# Patient Record
Sex: Male | Born: 1965 | Race: White | Hispanic: No | Marital: Married | Smoking: Former smoker
Health system: Southern US, Community
[De-identification: ages and names within clinical notes are randomized; demographics above are authoritative.]

## PROBLEM LIST (undated history)

## (undated) DIAGNOSIS — Z789 Other specified health status: Secondary | ICD-10-CM

## (undated) DIAGNOSIS — G5603 Carpal tunnel syndrome, bilateral upper limbs: Secondary | ICD-10-CM

---

## 1998-05-15 ENCOUNTER — Emergency Department (HOSPITAL_COMMUNITY): Admission: EM | Admit: 1998-05-15 | Discharge: 1998-05-16 | Payer: Self-pay | Admitting: Emergency Medicine

## 2002-08-05 HISTORY — PX: FRACTURE SURGERY: SHX138

## 2012-03-06 ENCOUNTER — Other Ambulatory Visit: Payer: Self-pay | Admitting: Orthopedic Surgery

## 2012-03-13 ENCOUNTER — Encounter (HOSPITAL_BASED_OUTPATIENT_CLINIC_OR_DEPARTMENT_OTHER): Payer: Self-pay | Admitting: *Deleted

## 2012-03-13 NOTE — Progress Notes (Signed)
No cardiac or resp problems-quit smoking 11 yr ago-

## 2012-03-18 ENCOUNTER — Encounter (HOSPITAL_BASED_OUTPATIENT_CLINIC_OR_DEPARTMENT_OTHER)
Admission: RE | Disposition: A | Discharge status: Home / Self Care | Payer: Self-pay | Source: Ambulatory Visit | Attending: Orthopedic Surgery

## 2012-03-18 ENCOUNTER — Encounter (HOSPITAL_BASED_OUTPATIENT_CLINIC_OR_DEPARTMENT_OTHER): Payer: Self-pay | Admitting: Orthopedic Surgery

## 2012-03-18 ENCOUNTER — Ambulatory Visit (HOSPITAL_BASED_OUTPATIENT_CLINIC_OR_DEPARTMENT_OTHER): Payer: 59 | Admitting: Certified Registered Nurse Anesthetist

## 2012-03-18 ENCOUNTER — Encounter (HOSPITAL_BASED_OUTPATIENT_CLINIC_OR_DEPARTMENT_OTHER): Payer: Self-pay | Admitting: *Deleted

## 2012-03-18 ENCOUNTER — Encounter (HOSPITAL_BASED_OUTPATIENT_CLINIC_OR_DEPARTMENT_OTHER): Payer: Self-pay | Admitting: Certified Registered Nurse Anesthetist

## 2012-03-18 ENCOUNTER — Encounter (HOSPITAL_BASED_OUTPATIENT_CLINIC_OR_DEPARTMENT_OTHER): Payer: Self-pay | Admitting: Anesthesiology

## 2012-03-18 ENCOUNTER — Ambulatory Visit (HOSPITAL_BASED_OUTPATIENT_CLINIC_OR_DEPARTMENT_OTHER)
Admission: RE | Admit: 2012-03-18 | Discharge: 2012-03-18 | Disposition: A | Discharge status: Home / Self Care | Payer: 59 | Source: Ambulatory Visit | Attending: Orthopedic Surgery | Admitting: Orthopedic Surgery

## 2012-03-18 DIAGNOSIS — Z791 Long term (current) use of non-steroidal anti-inflammatories (NSAID): Secondary | ICD-10-CM | POA: Insufficient documentation

## 2012-03-18 DIAGNOSIS — G5600 Carpal tunnel syndrome, unspecified upper limb: Secondary | ICD-10-CM | POA: Insufficient documentation

## 2012-03-18 HISTORY — DX: Other specified health status: Z78.9

## 2012-03-18 HISTORY — PX: CARPAL TUNNEL RELEASE: SHX101

## 2012-03-18 HISTORY — DX: Carpal tunnel syndrome, bilateral upper limbs: G56.03

## 2012-03-18 SURGERY — CARPAL TUNNEL RELEASE
Anesthesia: Monitor Anesthesia Care | Site: Hand | Laterality: Left | Wound class: Clean

## 2012-03-18 MED ORDER — DEXTROSE 5 % IV SOLN
3.0000 g | INTRAVENOUS | Status: AC
  Administered 2012-03-18: 3 g via INTRAVENOUS

## 2012-03-18 MED ORDER — FENTANYL CITRATE 0.05 MG/ML IJ SOLN
INTRAMUSCULAR | Status: DC | PRN
  Administered 2012-03-18 (×3): 50 ug via INTRAVENOUS

## 2012-03-18 MED ORDER — HYDROCODONE-ACETAMINOPHEN 5-500 MG PO TABS: 1.0000 | ORAL_TABLET | ORAL | Status: AC | PRN

## 2012-03-18 MED ORDER — OXYCODONE HCL 5 MG PO TABS: 5.0000 mg | ORAL_TABLET | Freq: Once | ORAL | Status: DC | PRN

## 2012-03-18 MED ORDER — FENTANYL CITRATE 0.05 MG/ML IJ SOLN: 25.0000 ug | INTRAMUSCULAR | Status: DC | PRN

## 2012-03-18 MED ORDER — METOCLOPRAMIDE HCL 5 MG/ML IJ SOLN: 10.0000 mg | Freq: Once | INTRAMUSCULAR | Status: DC | PRN

## 2012-03-18 MED ORDER — BUPIVACAINE HCL (PF) 0.25 % IJ SOLN
INTRAMUSCULAR | Status: DC | PRN
  Administered 2012-03-18: 5 mL

## 2012-03-18 MED ORDER — LACTATED RINGERS IV SOLN
INTRAVENOUS | Status: DC
  Administered 2012-03-18 (×3): via INTRAVENOUS

## 2012-03-18 MED ORDER — CHLORHEXIDINE GLUCONATE 4 % EX LIQD: 60.0000 mL | Freq: Once | CUTANEOUS | Status: DC

## 2012-03-18 MED ORDER — DEXAMETHASONE SODIUM PHOSPHATE 4 MG/ML IJ SOLN
INTRAMUSCULAR | Status: DC | PRN
  Administered 2012-03-18: 10 mg via INTRAVENOUS

## 2012-03-18 MED ORDER — MIDAZOLAM HCL 5 MG/5ML IJ SOLN
INTRAMUSCULAR | Status: DC | PRN
  Administered 2012-03-18 (×2): 1 mg via INTRAVENOUS

## 2012-03-18 MED ORDER — PROPOFOL 10 MG/ML IV EMUL
INTRAVENOUS | Status: DC | PRN
  Administered 2012-03-18: 100 ug/kg/min via INTRAVENOUS

## 2012-03-18 MED ORDER — LIDOCAINE HCL (PF) 0.5 % IJ SOLN
INTRAMUSCULAR | Status: DC | PRN
  Administered 2012-03-18: 60 mL via INTRATHECAL

## 2012-03-18 MED ORDER — OXYCODONE HCL 5 MG/5ML PO SOLN: 5.0000 mg | Freq: Once | ORAL | Status: DC | PRN

## 2012-03-18 MED ORDER — 0.9 % SODIUM CHLORIDE (POUR BTL) OPTIME
TOPICAL | Status: DC | PRN
  Administered 2012-03-18: 1000 mL

## 2012-03-18 SURGICAL SUPPLY — 35 items
BANDAGE GAUZE ELAST BULKY 4 IN (GAUZE/BANDAGES/DRESSINGS) ×2 IMPLANT
BLADE SURG 15 STRL LF DISP TIS (BLADE) ×1 IMPLANT
BLADE SURG 15 STRL SS (BLADE) ×2
BNDG CMPR 9X4 STRL LF SNTH (GAUZE/BANDAGES/DRESSINGS)
BNDG COHESIVE 3X5 TAN STRL LF (GAUZE/BANDAGES/DRESSINGS) ×2 IMPLANT
BNDG ESMARK 4X9 LF (GAUZE/BANDAGES/DRESSINGS) IMPLANT
CHLORAPREP W/TINT 26ML (MISCELLANEOUS) ×2 IMPLANT
CLOTH BEACON ORANGE TIMEOUT ST (SAFETY) ×2 IMPLANT
CORDS BIPOLAR (ELECTRODE) ×2 IMPLANT
COVER MAYO STAND STRL (DRAPES) ×2 IMPLANT
COVER TABLE BACK 60X90 (DRAPES) ×2 IMPLANT
CUFF TOURNIQUET SINGLE 18IN (TOURNIQUET CUFF) ×2 IMPLANT
DRAPE EXTREMITY T 121X128X90 (DRAPE) ×2 IMPLANT
DRAPE SURG 17X23 STRL (DRAPES) ×2 IMPLANT
DRSG KUZMA FLUFF (GAUZE/BANDAGES/DRESSINGS) ×2 IMPLANT
GAUZE XEROFORM 1X8 LF (GAUZE/BANDAGES/DRESSINGS) ×2 IMPLANT
GLOVE BIO SURGEON STRL SZ 6.5 (GLOVE) ×2 IMPLANT
GLOVE SURG ORTHO 8.0 STRL STRW (GLOVE) ×2 IMPLANT
GOWN BRE IMP PREV XXLGXLNG (GOWN DISPOSABLE) ×2 IMPLANT
GOWN PREVENTION PLUS XLARGE (GOWN DISPOSABLE) ×2 IMPLANT
NEEDLE 27GAX1X1/2 (NEEDLE) ×2 IMPLANT
NS IRRIG 1000ML POUR BTL (IV SOLUTION) ×2 IMPLANT
PACK BASIN DAY SURGERY FS (CUSTOM PROCEDURE TRAY) ×2 IMPLANT
PAD CAST 3X4 CTTN HI CHSV (CAST SUPPLIES) ×1 IMPLANT
PADDING CAST ABS 4INX4YD NS (CAST SUPPLIES) ×1
PADDING CAST ABS COTTON 4X4 ST (CAST SUPPLIES) ×1 IMPLANT
PADDING CAST COTTON 3X4 STRL (CAST SUPPLIES) ×2
SPONGE GAUZE 4X4 12PLY (GAUZE/BANDAGES/DRESSINGS) ×2 IMPLANT
STOCKINETTE 4X48 STRL (DRAPES) ×2 IMPLANT
SUT VICRYL 4-0 PS2 18IN ABS (SUTURE) IMPLANT
SUT VICRYL RAPIDE 4/0 PS 2 (SUTURE) ×2 IMPLANT
SYR BULB 3OZ (MISCELLANEOUS) ×2 IMPLANT
SYR CONTROL 10ML LL (SYRINGE) ×2 IMPLANT
TOWEL OR 17X24 6PK STRL BLUE (TOWEL DISPOSABLE) ×2 IMPLANT
UNDERPAD 30X30 INCONTINENT (UNDERPADS AND DIAPERS) ×2 IMPLANT

## 2012-03-18 NOTE — Anesthesia Postprocedure Evaluation (Signed)
Anesthesia Post Note  Patient: Douglas Ramos  Procedure(s) Performed: Procedure(s) (LRB): CARPAL TUNNEL RELEASE (Left)  Anesthesia type: MAC  Patient location: PACU  Post pain: Pain level controlled  Post assessment: Patient's Cardiovascular Status Stable  Last Vitals:  Filed Vitals:   03/18/12 1253  BP: 150/83  Pulse: 69  Temp: 36.5 C  Resp: 20    Post vital signs: Reviewed and stable  Level of consciousness: alert  Complications: No apparent anesthesia complications

## 2012-03-18 NOTE — Transfer of Care (Signed)
Immediate Anesthesia Transfer of Care Note  Patient: Douglas Ramos  Procedure(s) Performed: Procedure(s) (LRB): CARPAL TUNNEL RELEASE (Left)  Patient Location: PACU  Anesthesia Type: MAC and Bier block  Level of Consciousness: awake and alert   Airway & Oxygen Therapy: Patient Spontanous Breathing and Patient connected to face mask oxygen  Post-op Assessment: Report given to PACU RN and Post -op Vital signs reviewed and stable  Post vital signs: Reviewed and stable  Complications: No apparent anesthesia complications

## 2012-03-18 NOTE — Anesthesia Preprocedure Evaluation (Signed)
Anesthesia Evaluation  Patient identified by MRN, date of birth, ID band Patient awake    Reviewed: Allergy & Precautions, H&P , NPO status , Patient's Chart, lab work & pertinent test results, reviewed documented beta blocker date and time   Airway Mallampati: II TM Distance: >3 FB Neck ROM: full    Dental   Pulmonary neg pulmonary ROS, former smoker,  breath sounds clear to auscultation        Cardiovascular negative cardio ROS  Rhythm:regular     Neuro/Psych negative neurological ROS  negative psych ROS   GI/Hepatic negative GI ROS, Neg liver ROS,   Endo/Other  negative endocrine ROS  Renal/GU negative Renal ROS  negative genitourinary   Musculoskeletal   Abdominal   Peds  Hematology negative hematology ROS (+)   Anesthesia Other Findings See surgeon's H&P   Reproductive/Obstetrics negative OB ROS                           Anesthesia Physical Anesthesia Plan  ASA: II  Anesthesia Plan: MAC and Bier Block   Post-op Pain Management:    Induction: Intravenous  Airway Management Planned: Simple Face Mask  Additional Equipment:   Intra-op Plan:   Post-operative Plan:   Informed Consent: I have reviewed the patients History and Physical, chart, labs and discussed the procedure including the risks, benefits and alternatives for the proposed anesthesia with the patient or authorized representative who has indicated his/her understanding and acceptance.   Dental Advisory Given  Plan Discussed with: CRNA and Surgeon  Anesthesia Plan Comments:         Anesthesia Quick Evaluation

## 2012-03-18 NOTE — Brief Op Note (Signed)
03/18/2012  12:19 PM  PATIENT:  Douglas Ramos  46 y.o. male  PRE-OPERATIVE DIAGNOSIS:  LEFT CARPAL TUNNEL SYNDROME  POST-OPERATIVE DIAGNOSIS:  LEFT CARPAL TUNNEL SYNDROME  PROCEDURE:  Procedure(s) (LRB): CARPAL TUNNEL RELEASE (Left)  SURGEON:  Surgeon(s) and Role:    * Nicki Reaper, MD - Primary  PHYSICIAN ASSISTANT:   ASSISTANTS: none   ANESTHESIA:   local and regional  EBL:     BLOOD ADMINISTERED:none  DRAINS: none   LOCAL MEDICATIONS USED:  MARCAINE     SPECIMEN:  No Specimen  DISPOSITION OF SPECIMEN:  N/A  COUNTS:  YES  TOURNIQUET:   Total Tourniquet Time Documented: Forearm (Left) - 19 minutes  DICTATION: .Other Dictation: Dictation Number [id number]  PLAN OF CARE: Discharge to home after PACU  PATIENT DISPOSITION:  PACU - hemodynamically stable.

## 2012-03-18 NOTE — Op Note (Signed)
Dictated number: [id number]

## 2012-03-18 NOTE — H&P (Signed)
Douglas Ramos is a 46 year old right hand dominant male who comes in complaining of bilateral hand numbness, tingling and pain to all fingers. This has been going on for the past 3-4 years. It has been increasing. He is awakened 6-7 out of 7 nights. He has no history of injury to the hands or neck. The left is slightly worse than the right. No history of diabetes, thyroid problems, arthritis or gout. There is no family history of each of these. He states driving is a problem for him with numbness and tingling. He complains of moderate, throbbing and aching pain. This has been gradually getting worse. Activity makes it worse. He has tried Advil 2-3 a day along with braces which have not helped.   Past Medical History: He has no known drug allergies. He states no medicines. He has had no surgery.  Family Medical History: Negative.  Social History: He does not smoke. He drinks socially. 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  Review of Systems: Positive for glasses otherwise negative for 14 points.  Douglas Ramos is an 46 y.o. male.   Chief Complaint: CTS Lt  HPI: see above  Past Medical History  Diagnosis Date  . No pertinent past medical history   . Carpal tunnel syndrome, bilateral     Past Surgical History  Procedure Date  . Fracture surgery 2004    orif lt foot-pins/screws    History reviewed. No pertinent family history. Social History:  reports that he quit smoking about 11 years ago. He does not have any smokeless tobacco history on file. He reports that he drinks alcohol. He reports that he does not use illicit drugs.  Allergies: No Known Allergies  Medications Prior to Admission  Medication Sig Dispense Refill  . ibuprofen (ADVIL,MOTRIN) 200 MG tablet Take 200 mg by mouth every 6 (six) hours as needed.        No results found for this or any previous visit (from the past 48 hour(s)).  No results found.   Pertinent items are noted in HPI.  Blood pressure  128/80, pulse 56, temperature 98.4 F (36.9 C), temperature source Oral, resp. rate 16, height 6\' 1"  (1.854 m), weight 202 lb (91.627 kg), SpO2 99.00%.  General appearance: alert, cooperative and appears stated age Head: Normocephalic, without obvious abnormality Neck: no adenopathy Resp: clear to auscultation bilaterally Cardio: regular rate and rhythm, S1, S2 normal, no murmur, click, rub or gallop GI: soft, non-tender; bowel sounds normal; no masses,  no organomegaly Extremities: extremities normal, atraumatic, no cyanosis or edema Pulses: 2+ and symmetric Skin: Skin color, texture, turgor normal. No rashes or lesions Neurologic: Grossly normal Incision/Wound: na  Assessment/Plan Diagnosis: Probable carpal tunnel syndrome Douglas Ramos has had his nerve conductions done by Dr. Johna Roles revealing a motor delay of 5.1 on the left,  5.6 on the right, sensory delay of 3.0 on the left and 2.3 on the right with an amplitude diminution of 10.7 on the left and 9.7 on the right.  We have discussed with him the possibility of injections vs surgical release. The pre, peri and post op course are discussed along with risks and complications. He is aware there is no guarantee with surgery, possibility of infection, recurrence, injury to arteries, nerves and tendons, incomplete relief of symptoms and dystrophy.  He would like to proceed to have his left side done foregoing injections. He is scheduled for left carpal tunnel release as an outpatient at [facility].  Douglas Ramos R 03/18/2012, 11:06 AM

## 2012-03-18 NOTE — Anesthesia Procedure Notes (Addendum)
Procedure Name: MAC Date/Time: 03/18/2012 12:00 PM Performed by: Sherrika Weakland D Pre-anesthesia Checklist: Patient identified, Emergency Drugs available, Suction available and Patient being monitored Patient Re-evaluated:Patient Re-evaluated prior to inductionOxygen Delivery Method: Simple face mask Intubation Type: IV induction Placement Confirmation: positive ETCO2   Anesthesia Regional Block:  Bier block (IV Regional)  Pre-Anesthetic Checklist: ,, timeout performed, Correct Patient, Correct Site, Correct Laterality, Correct Procedure,, site marked, risks and benefits discussed, Surgical consent, Pre-op evaluation,  At surgeon's request  Laterality: Left     Needles:  Injection technique: Single-shot      Needle Gauge: 20 and 20 G    Additional Needles: Bier block (IV Regional) Narrative:   Performed by: With CRNAs   Additional Notes: 35cc 0.5% preservative free lidocaine injected, pt tolerated well.

## 2012-03-19 ENCOUNTER — Encounter (HOSPITAL_BASED_OUTPATIENT_CLINIC_OR_DEPARTMENT_OTHER): Payer: Self-pay | Admitting: Orthopedic Surgery

## 2012-03-19 NOTE — Op Note (Signed)
NAMEWALLACE, GAPPA                   ACCOUNT NO.:  [id number]  MEDICAL RECORD NO.:  [id number]  LOCATION:                                 FACILITY:  PHYSICIAN:  Cindee Salt, M.D.            DATE OF BIRTH:  DATE OF PROCEDURE:  03/18/2012 DATE OF DISCHARGE:                              OPERATIVE REPORT   PREOPERATIVE DIAGNOSIS:  Carpal tunnel syndrome, left hand.  POSTOPERATIVE DIAGNOSIS:  Carpal tunnel syndrome, left hand.  OPERATION:  Decompression of left median nerve.  SURGEON:  Cindee Salt, MD  ANESTHESIA:  Forearm-based IV regional with local infiltration.  ANESTHESIOLOGIST:  Janetta Hora. Gelene Mink, M.D.  HISTORY:  The patient is a 46 year old male with history of carpal tunnel syndrome, EMG nerve conductions positive, not responsive to conservative treatment.  He has elected to undergo surgical decompression.  Pre, peri, and postoperative course had been discussed along with risks and complications.  He is aware that there is no guarantee with the surgery, possibility of infection, recurrence, injury to arteries, nerves, tendons, incomplete relief of symptoms, dystrophy. In the preoperative area, the patient was seen, the extremity marked by both the patient and surgeon.  Antibiotic given.  PROCEDURE IN DETAIL:  The patient was brought to the operating room where a forearm-based IV regional anesthetic was carried out without difficulty, was prepped using ChloraPrep, supine position, left arm free.  A 3-minute dry time was allowed.  Time-out taken, confirming the patient and procedure.  After adequate anesthesia was afforded, a longitudinal incision was made in the palm, carried down through subcutaneous tissue.  Bleeders were electrocauterized with bipolar. Palmar fascia was split.  Superficial palmar arch identified.  The flexor tendon and the ring little finger identified to the ulnar side of the median nerve.  Carpal retinaculum was incised with sharp dissection. Right  angle and Sewell retractor were placed between skin and forearm fascia.  The fascia was released for approximately 0.5 cm proximal to the wrist crease.  With a connecting branch between the ulnar nerve and median nerve, this was preserved.  The wound was copiously irrigated with saline.  The skin then closed with interrupted 4-0 Vicryl Rapide sutures.  Local infiltration with 0.25% Marcaine without epinephrine was given, was noted that the nerve was very hyperemic following the decompression indicative of a significant carpal tunnel syndrome, being present.  On deflation of the tourniquet, all fingers immediately pinked.  Compressive dressing.  No splint was applied with the fingers free.  On deflation of the tourniquet, all fingers immediately pinked.  He was taken to the recovery room for observation in satisfactory condition.          ______________________________ Cindee Salt, M.D.     GK/MEDQ  D:  03/18/2012  T:  03/19/2012  Job:  [id number]

## 2012-06-30 ENCOUNTER — Other Ambulatory Visit: Payer: Self-pay | Admitting: Orthopedic Surgery

## 2012-07-21 ENCOUNTER — Encounter (HOSPITAL_BASED_OUTPATIENT_CLINIC_OR_DEPARTMENT_OTHER): Payer: Self-pay | Admitting: *Deleted

## 2012-07-21 NOTE — Progress Notes (Signed)
Here 8/13 for [facility]-did well

## 2012-07-22 ENCOUNTER — Encounter (HOSPITAL_BASED_OUTPATIENT_CLINIC_OR_DEPARTMENT_OTHER): Payer: Self-pay | Admitting: Anesthesiology

## 2012-07-22 ENCOUNTER — Encounter (HOSPITAL_BASED_OUTPATIENT_CLINIC_OR_DEPARTMENT_OTHER)
Admission: RE | Disposition: A | Discharge status: Home / Self Care | Payer: Self-pay | Source: Ambulatory Visit | Attending: Orthopedic Surgery

## 2012-07-22 ENCOUNTER — Ambulatory Visit (HOSPITAL_BASED_OUTPATIENT_CLINIC_OR_DEPARTMENT_OTHER): Payer: 59 | Admitting: Anesthesiology

## 2012-07-22 ENCOUNTER — Encounter (HOSPITAL_BASED_OUTPATIENT_CLINIC_OR_DEPARTMENT_OTHER): Payer: Self-pay

## 2012-07-22 ENCOUNTER — Ambulatory Visit (HOSPITAL_BASED_OUTPATIENT_CLINIC_OR_DEPARTMENT_OTHER)
Admission: RE | Admit: 2012-07-22 | Discharge: 2012-07-22 | Disposition: A | Discharge status: Home / Self Care | Payer: 59 | Source: Ambulatory Visit | Attending: Orthopedic Surgery | Admitting: Orthopedic Surgery

## 2012-07-22 DIAGNOSIS — G5600 Carpal tunnel syndrome, unspecified upper limb: Secondary | ICD-10-CM | POA: Insufficient documentation

## 2012-07-22 HISTORY — PX: CARPAL TUNNEL RELEASE: SHX101

## 2012-07-22 SURGERY — CARPAL TUNNEL RELEASE
Anesthesia: Monitor Anesthesia Care | Site: Wrist | Laterality: Right | Wound class: Clean

## 2012-07-22 MED ORDER — BUPIVACAINE HCL (PF) 0.25 % IJ SOLN
INTRAMUSCULAR | Status: DC | PRN
  Administered 2012-07-22: 6 mL

## 2012-07-22 MED ORDER — FENTANYL CITRATE 0.05 MG/ML IJ SOLN
INTRAMUSCULAR | Status: DC | PRN
  Administered 2012-07-22: 100 ug via INTRAVENOUS

## 2012-07-22 MED ORDER — LIDOCAINE HCL (PF) 0.5 % IJ SOLN
INTRAMUSCULAR | Status: DC | PRN
  Administered 2012-07-22: 30 mL via INTRAVENOUS

## 2012-07-22 MED ORDER — MIDAZOLAM HCL 5 MG/5ML IJ SOLN
INTRAMUSCULAR | Status: DC | PRN
  Administered 2012-07-22: 2 mg via INTRAVENOUS

## 2012-07-22 MED ORDER — HYDROCODONE-ACETAMINOPHEN 5-325 MG PO TABS: 1.0000 | ORAL_TABLET | Freq: Four times a day (QID) | ORAL | Status: AC | PRN

## 2012-07-22 MED ORDER — LACTATED RINGERS IV SOLN
INTRAVENOUS | Status: DC
  Administered 2012-07-22: 09:00:00 via INTRAVENOUS

## 2012-07-22 MED ORDER — CHLORHEXIDINE GLUCONATE 4 % EX LIQD: 60.0000 mL | Freq: Once | CUTANEOUS | Status: DC

## 2012-07-22 MED ORDER — ONDANSETRON HCL 4 MG/2ML IJ SOLN
INTRAMUSCULAR | Status: DC | PRN
  Administered 2012-07-22: 4 mg via INTRAVENOUS

## 2012-07-22 MED ORDER — CEFAZOLIN SODIUM-DEXTROSE 2-3 GM-% IV SOLR
2.0000 g | INTRAVENOUS | Status: AC
  Administered 2012-07-22: 2 g via INTRAVENOUS

## 2012-07-22 MED ORDER — PROPOFOL 10 MG/ML IV EMUL
INTRAVENOUS | Status: DC | PRN
  Administered 2012-07-22: 100 ug/kg/min via INTRAVENOUS

## 2012-07-22 SURGICAL SUPPLY — 37 items
BANDAGE GAUZE ELAST BULKY 4 IN (GAUZE/BANDAGES/DRESSINGS) ×2 IMPLANT
BLADE SURG 15 STRL LF DISP TIS (BLADE) ×1 IMPLANT
BLADE SURG 15 STRL SS (BLADE) ×2
BNDG CMPR 9X4 STRL LF SNTH (GAUZE/BANDAGES/DRESSINGS)
BNDG COHESIVE 3X5 TAN STRL LF (GAUZE/BANDAGES/DRESSINGS) ×2 IMPLANT
BNDG ESMARK 4X9 LF (GAUZE/BANDAGES/DRESSINGS) IMPLANT
CHLORAPREP W/TINT 26ML (MISCELLANEOUS) ×2 IMPLANT
CLOTH BEACON ORANGE TIMEOUT ST (SAFETY) ×2 IMPLANT
CORDS BIPOLAR (ELECTRODE) ×2 IMPLANT
COVER MAYO STAND STRL (DRAPES) ×2 IMPLANT
COVER TABLE BACK 60X90 (DRAPES) ×2 IMPLANT
CUFF TOURNIQUET SINGLE 18IN (TOURNIQUET CUFF) ×2 IMPLANT
DRAPE EXTREMITY T 121X128X90 (DRAPE) ×2 IMPLANT
DRAPE SURG 17X23 STRL (DRAPES) ×2 IMPLANT
DRSG KUZMA FLUFF (GAUZE/BANDAGES/DRESSINGS) ×2 IMPLANT
GAUZE XEROFORM 1X8 LF (GAUZE/BANDAGES/DRESSINGS) ×2 IMPLANT
GLOVE BIO SURGEON STRL SZ 6.5 (GLOVE) ×2 IMPLANT
GLOVE BIOGEL PI IND STRL 8.5 (GLOVE) ×1 IMPLANT
GLOVE BIOGEL PI INDICATOR 8.5 (GLOVE) ×1
GLOVE SURG ORTHO 8.0 STRL STRW (GLOVE) ×2 IMPLANT
GOWN BRE IMP PREV XXLGXLNG (GOWN DISPOSABLE) ×2 IMPLANT
GOWN PREVENTION PLUS XLARGE (GOWN DISPOSABLE) ×2 IMPLANT
NEEDLE 27GAX1X1/2 (NEEDLE) ×1 IMPLANT
NS IRRIG 1000ML POUR BTL (IV SOLUTION) ×2 IMPLANT
PACK BASIN DAY SURGERY FS (CUSTOM PROCEDURE TRAY) ×2 IMPLANT
PAD CAST 3X4 CTTN HI CHSV (CAST SUPPLIES) ×1 IMPLANT
PADDING CAST ABS 4INX4YD NS (CAST SUPPLIES) ×1
PADDING CAST ABS COTTON 4X4 ST (CAST SUPPLIES) ×1 IMPLANT
PADDING CAST COTTON 3X4 STRL (CAST SUPPLIES) ×2
SPONGE GAUZE 4X4 12PLY (GAUZE/BANDAGES/DRESSINGS) ×2 IMPLANT
STOCKINETTE 4X48 STRL (DRAPES) ×2 IMPLANT
SUT VICRYL 4-0 PS2 18IN ABS (SUTURE) IMPLANT
SUT VICRYL RAPIDE 4/0 PS 2 (SUTURE) ×2 IMPLANT
SYR BULB 3OZ (MISCELLANEOUS) ×2 IMPLANT
SYR CONTROL 10ML LL (SYRINGE) ×1 IMPLANT
TOWEL OR 17X24 6PK STRL BLUE (TOWEL DISPOSABLE) ×2 IMPLANT
UNDERPAD 30X30 INCONTINENT (UNDERPADS AND DIAPERS) ×2 IMPLANT

## 2012-07-22 NOTE — Transfer of Care (Signed)
Immediate Anesthesia Transfer of Care Note  Patient: Douglas Ramos  Procedure(s) Performed: Procedure(s) (LRB) with comments: CARPAL TUNNEL RELEASE (Right)  Patient Location: PACU  Anesthesia Type:Bier block  Level of Consciousness: awake, alert  and oriented  Airway & Oxygen Therapy: Patient Spontanous Breathing and Patient connected to face mask oxygen  Post-op Assessment: Report given to PACU RN and Post -op Vital signs reviewed and stable  Post vital signs: Reviewed and stable  Complications: No apparent anesthesia complications

## 2012-07-22 NOTE — Anesthesia Postprocedure Evaluation (Signed)
  Anesthesia Post-op Note  Patient: Douglas Ramos  Procedure(s) Performed: Procedure(s) (LRB) with comments: CARPAL TUNNEL RELEASE (Right)  Patient Location: PACU  Anesthesia Type:MAC and Bier block  Level of Consciousness: awake, alert  and oriented  Airway and Oxygen Therapy: Patient Spontanous Breathing  Post-op Pain: none  Post-op Assessment: Post-op Vital signs reviewed  Post-op Vital Signs: Reviewed  Complications: No apparent anesthesia complications

## 2012-07-22 NOTE — Anesthesia Preprocedure Evaluation (Signed)
Anesthesia Evaluation  Patient identified by MRN, date of birth, ID band Patient awake    Reviewed: Allergy & Precautions, H&P , NPO status , Patient's Chart, lab work & pertinent test results  Airway Mallampati: I      Dental  (+) Teeth Intact and Dental Advisory Given   Pulmonary  breath sounds clear to auscultation        Cardiovascular Rhythm:Regular Rate:Normal     Neuro/Psych    GI/Hepatic   Endo/Other    Renal/GU      Musculoskeletal   Abdominal   Peds  Hematology   Anesthesia Other Findings   Reproductive/Obstetrics                           Anesthesia Physical Anesthesia Plan  ASA: I  Anesthesia Plan: Bier Block   Post-op Pain Management:    Induction: Intravenous  Airway Management Planned: Simple Face Mask  Additional Equipment:   Intra-op Plan:   Post-operative Plan:   Informed Consent: I have reviewed the patients History and Physical, chart, labs and discussed the procedure including the risks, benefits and alternatives for the proposed anesthesia with the patient or authorized representative who has indicated his/her understanding and acceptance.   Dental advisory given  Plan Discussed with: CRNA, Anesthesiologist and Surgeon  Anesthesia Plan Comments:         Anesthesia Quick Evaluation

## 2012-07-22 NOTE — Op Note (Signed)
Dictation Number [id number]

## 2012-07-22 NOTE — H&P (Signed)
Douglas Ramos is a 46 year old right hand dominant male who comes in complaining of bilateral hand numbness, tingling and pain to all fingers. This has been going on for the past 3-4 years. It has been increasing. He is awakened 6-7 out of 7 nights. He has no history of injury to the hands or neck. The left is slightly worse than the right. No history of diabetes, thyroid problems, arthritis or gout. There is no family history of each of these. He states driving is a problem for him with numbness and tingling. He complains of moderate, throbbing and aching pain. This has been gradually getting worse. Activity makes it worse. He has tried Advil 2-3 a day along with braces which have not helped. He has had his nerve conductions done by Dr. Johna Ramos revealing a motor delay of 5.1 on the left,  5.6 on the right, sensory delay of 3.0 on the left and 2.3 on the right with an amplitude diminution of 10.7 on the left and 9.7 on the right.  Past Medical History: He has no known drug allergies. He states no medicines. He has had no surgery.  Family Medical History: Negative.  Social History: He does not smoke. He drinks socially. 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   Chief Complaint: CTS RT HPI: see above  Past Medical History  Diagnosis Date  . No pertinent past medical history   . Carpal tunnel syndrome, bilateral     Past Surgical History  Procedure Date  . Fracture surgery 2004    orif lt foot-pins/screws  . Carpal tunnel release 03/18/2012    Procedure: CARPAL TUNNEL RELEASE;  Surgeon: Nicki Reaper, MD;  Location: [facility];  Service: Orthopedics;  Laterality: Left;    History reviewed. No pertinent family history. Social History:  reports that he quit smoking about 11 years ago. He does not have any smokeless tobacco history on file. He reports that he drinks alcohol. He reports that he does not use illicit drugs.  Allergies: No  Known Allergies  Medications Prior to Admission  Medication Sig Dispense Refill  . ibuprofen (ADVIL,MOTRIN) 200 MG tablet Take 200 mg by mouth every 6 (six) hours as needed.        No results found for this or any previous visit (from the past 48 hour(s)).  No results found.   Pertinent items are noted in HPI.  Blood pressure 123/80, pulse 63, temperature 97.9 F (36.6 C), temperature source Oral, resp. rate 20, height 6\' 1"  (1.854 m), weight 93.078 kg (205 lb 3.2 oz), SpO2 98.00%.  General appearance: alert, cooperative and appears stated age Head: Normocephalic, without obvious abnormality Neck: no adenopathy Resp: clear to auscultation bilaterally Cardio: regular rate and rhythm, S1, S2 normal, no murmur, click, rub or gallop GI: soft, non-tender; bowel sounds normal; no masses,  no organomegaly Extremities: extremities normal, atraumatic, no cyanosis or edema Pulses: 2+ and symmetric Skin: Skin color, texture, turgor normal. No rashes or lesions Neurologic: Grossly normal Incision/Wound: na  Assessment/Plan We have discussed with him the possibility of injections vs surgical release. The pre, peri and post op course are discussed along with risks and complications. He is aware there is no guarantee with surgery, possibility of infection, recurrence, injury to arteries, nerves and tendons, incomplete relief of symptoms and dystrophy.  He would like to proceed to have his right side done foregoing injections. He is scheduled for left  carpal tunnel release as an outpatient   Douglas Ramos 07/22/2012, 8:38 AM

## 2012-07-22 NOTE — Brief Op Note (Signed)
07/22/2012  10:07 AM  PATIENT:  Douglas Ramos  46 y.o. male  PRE-OPERATIVE DIAGNOSIS:  RIGHT CARPAL TUNNEL SYNDROME  POST-OPERATIVE DIAGNOSIS:  RIGHT CARPAL TUNNEL SYNDROME  PROCEDURE:  Procedure(s) (LRB) with comments: CARPAL TUNNEL RELEASE (Right)  SURGEON:  Surgeon(s) and Role:    * Nicki Reaper, MD - Primary  PHYSICIAN ASSISTANT:   ASSISTANTS: none   ANESTHESIA:   local and regional  EBL:  Total I/O In: 400 [I.V.:400] Out: -   BLOOD ADMINISTERED:none  DRAINS: none   LOCAL MEDICATIONS USED:  MARCAINE     SPECIMEN:  No Specimen  DISPOSITION OF SPECIMEN:  N/A  COUNTS:  YES  TOURNIQUET:   Total Tourniquet Time Documented: Forearm (Right) - 16 minutes  DICTATION: .Other Dictation: Dictation Number [id number]  PLAN OF CARE: Discharge to home after PACU  PATIENT DISPOSITION:  PACU - hemodynamically stable.

## 2012-07-23 ENCOUNTER — Encounter (HOSPITAL_BASED_OUTPATIENT_CLINIC_OR_DEPARTMENT_OTHER): Payer: Self-pay | Admitting: Orthopedic Surgery

## 2012-07-23 NOTE — Op Note (Signed)
NAMEROBIE, MCNIEL                   ACCOUNT NO.:  [id number]  MEDICAL RECORD NO.:  [id number]  LOCATION:                                 FACILITY:  PHYSICIAN:  Cindee Salt, M.D.            DATE OF BIRTH:  DATE OF PROCEDURE:  07/22/2012 DATE OF DISCHARGE:                              OPERATIVE REPORT   PREOPERATIVE DIAGNOSIS:  Right carpal tunnel syndrome.  POSTOPERATIVE DIAGNOSIS:  Right carpal tunnel syndrome.  OPERATION:  Decompression, right median nerve.  SURGEON:  Cindee Salt, MD  ASSISTANT:  None.  ANESTHESIA:  Forearm-based IV regional with local infiltration.  ANESTHESIOLOGIST:  Sheldon Silvan, MD  HISTORY:  The patient is a 46 year old male with a history of carpal tunnel syndrome, EMG nerve conductions positive.  He has undergone carpal tunnel release on his left side.  He is admitted now for release of his right median nerve.  Pre, peri, and postoperative course have been discussed along with risks and complications.  He is aware that there is no guarantee with the surgery; possibility of infection; recurrence of injury to arteries, nerves, tendons, incomplete relief of symptoms, dystrophy.  In preoperative area, the patient is seen, the extremity marked by both patient and surgeon.  Antibiotic given.  PROCEDURE:  The patient was brought to the operating room, where forearm- based IV regional anesthetic was carried out without difficulty.  He was prepped using ChloraPrep, supine position, right arm free.  A 3-minute dry time was allowed.  Time-out taken, confirming patient and procedure. A longitudinal incision was made in the palm, carried down through subcutaneous tissue.  Bleeders were electrocauterized.  Palmar fascia was split.  Superficial palmar arch identified.  The flexor tendon to the ring little finger identified to the ulnar side of the median nerve. Carpal retinaculum was incised with sharp dissection.  Right angle and Sewall retractor were placed  between skin and forearm fascia.  The fascia released for approximately a centimeter and half proximal to the wrist crease under direct vision.  Canal was explored.  No further lesions were identified.  Area of compression to the nerve was apparent. Motor branch was noted entering the muscle.  The wound was copiously irrigated with saline and skin closed with interrupted 4-0 Vicryl Rapide sutures.  Local infiltration with 0.25% Marcaine without epinephrine was given to the area.  Approximately 6 mL was used.  Sterile compressive dressing with fingers free was applied.  On deflation of the tourniquet, all fingers immediately pinked.  He was taken to the recovery room for observation in satisfactory condition.  He will be discharged home to return to the [facility] [place] in 1 week on Vicodin.          ______________________________ Cindee Salt, M.D.     GK/MEDQ  D:  07/22/2012  T:  07/22/2012  Job:  [id number]

## 2012-11-16 ENCOUNTER — Emergency Department (INDEPENDENT_AMBULATORY_CARE_PROVIDER_SITE_OTHER): Payer: 59

## 2012-11-16 ENCOUNTER — Encounter (HOSPITAL_COMMUNITY): Payer: Self-pay | Admitting: Emergency Medicine

## 2012-11-16 ENCOUNTER — Emergency Department (HOSPITAL_COMMUNITY)
Admission: EM | Admit: 2012-11-16 | Discharge: 2012-11-16 | Disposition: A | Discharge status: Home / Self Care | Payer: 59 | Source: Home / Self Care | Attending: Emergency Medicine | Admitting: Emergency Medicine

## 2012-11-16 ENCOUNTER — Emergency Department (HOSPITAL_COMMUNITY): Payer: 59

## 2012-11-16 DIAGNOSIS — L089 Local infection of the skin and subcutaneous tissue, unspecified: Secondary | ICD-10-CM

## 2012-11-16 DIAGNOSIS — S91309A Unspecified open wound, unspecified foot, initial encounter: Secondary | ICD-10-CM

## 2012-11-16 MED ORDER — AMOXICILLIN-POT CLAVULANATE 875-125 MG PO TABS: 1.0000 | ORAL_TABLET | Freq: Two times a day (BID) | ORAL | Status: AC

## 2012-11-16 NOTE — ED Provider Notes (Signed)
History     CSN: [id number]  Arrival date & time 11/16/12  1158   First MD Initiated Contact with Patient 11/16/12 1413      Chief Complaint  Patient presents with  . Foreign Body in Skin    (Consider location/radiation/quality/duration/timing/severity/associated sxs/prior treatment) HPI Comments: Pt reports playing in yard barefoot and stepping on stick. 1 inch long splinter lodged in plantar foot between 4th and 5th toes, pt removed it all, washed wound with peroxide and applied neosporin. Today foot is tender and swollen, red and warm.   Patient is a 47 y.o. male presenting with foreign body. The history is provided by the patient.  Foreign Body  The current episode started yesterday. Intake: in skin of R foot. Suspected object: splinter. Pertinent negatives include no fever.    Past Medical History  Diagnosis Date  . No pertinent past medical history   . Carpal tunnel syndrome, bilateral     Past Surgical History  Procedure Laterality Date  . Fracture surgery  2004    orif lt foot-pins/screws  . Carpal tunnel release  03/18/2012    Procedure: CARPAL TUNNEL RELEASE;  Surgeon: Nicki Reaper, MD;  Location: [facility];  Service: Orthopedics;  Laterality: Left;  . Carpal tunnel release  07/22/2012    Procedure: CARPAL TUNNEL RELEASE;  Surgeon: Nicki Reaper, MD;  Location: [facility];  Service: Orthopedics;  Laterality: Right;    History reviewed. No pertinent family history.  History  Substance Use Topics  . Smoking status: Former Smoker    Quit date: 03/13/2001  . Smokeless tobacco: Not on file  . Alcohol Use: Yes     Comment: occ      Review of Systems  Constitutional: Negative for fever and chills.  Skin: Positive for color change and wound.    Allergies  Review of patient's allergies indicates no known allergies.  Home Medications   Current Outpatient Rx  Name  Route  Sig  Dispense  Refill  . amoxicillin-clavulanate  (AUGMENTIN) 875-125 MG per tablet   Oral   Take 1 tablet by mouth 2 (two) times daily.   20 tablet   0   . HYDROcodone-acetaminophen (NORCO) 5-325 MG per tablet   Oral   Take 1 tablet by mouth every 6 (six) hours as needed for pain.   10 tablet   0   . ibuprofen (ADVIL,MOTRIN) 200 MG tablet   Oral   Take 200 mg by mouth every 6 (six) hours as needed.           BP 169/94  Pulse 90  Temp(Src) 98 F (36.7 C) (Oral)  Resp 20  SpO2 100%  Physical Exam  Constitutional: He appears well-developed and well-nourished. No distress.  Musculoskeletal:       Right foot: He exhibits tenderness and swelling.       Feet:  Skin: Skin is warm and dry. There is erythema.  See msk exam    ED Course  Procedures (including critical care time)  Labs Reviewed - No data to display Dg Foot Complete Right  11/16/2012  *RADIOLOGY REPORT*  Clinical Data: Stepped on stick yesterday with possible foreign body, pain and swelling  RIGHT FOOT COMPLETE - 3+ VIEW  Comparison: None.  Findings: No opaque foreign body is seen.  No fracture is noted. Alignment is normal and joint spaces appear normal.  IMPRESSION: No opaque foreign body.  No fracture.   Original Report Authenticated By: Dwyane Dee, M.D.  1. Puncture wound of foot excluding toes with infection, right, initial encounter       MDM  No foreign body on x-ray. Discussed at length with pt possibility of retained foreign body not visible on x-ray.  Pt is to monitor for s/sx infection. If worsening, to f/u immediately with ortho for exploration.  If stable on antibiotics but worse when course is finished, is to f/u immediately with ortho.  Pt agrees and verbalizes understanding of plan.          Cathlyn Parsons, NP 11/16/12 1529

## 2012-11-16 NOTE — ED Notes (Signed)
Pt c/o poss wooden splinter in between 4th and 5th toe from greater toe of right foot onset yest Reports playing in the yard when he stepped on a branch Sx include: swelling, redness, localized fever States he pulled it out and soaked his foot in peroxide water and used neosporin   He is alert and oriented w/no signs of acute distress.

## 2012-11-16 NOTE — ED Provider Notes (Signed)
Medical screening examination/treatment/procedure(s) were performed by non-physician practitioner and as supervising physician I was immediately available for consultation/collaboration.  Raynald Blend, MD 11/16/12 1536

## 2014-02-25 IMAGING — CR DG FOOT COMPLETE 3+V*R*
3 series · 3 of 3 positions shown · non-contrast
Comparison: None.

CLINICAL DATA: Stepped on stick yesterday with possible foreign
body, pain and swelling

RIGHT FOOT COMPLETE - 3+ VIEW

[view not recorded (1 of 3)]
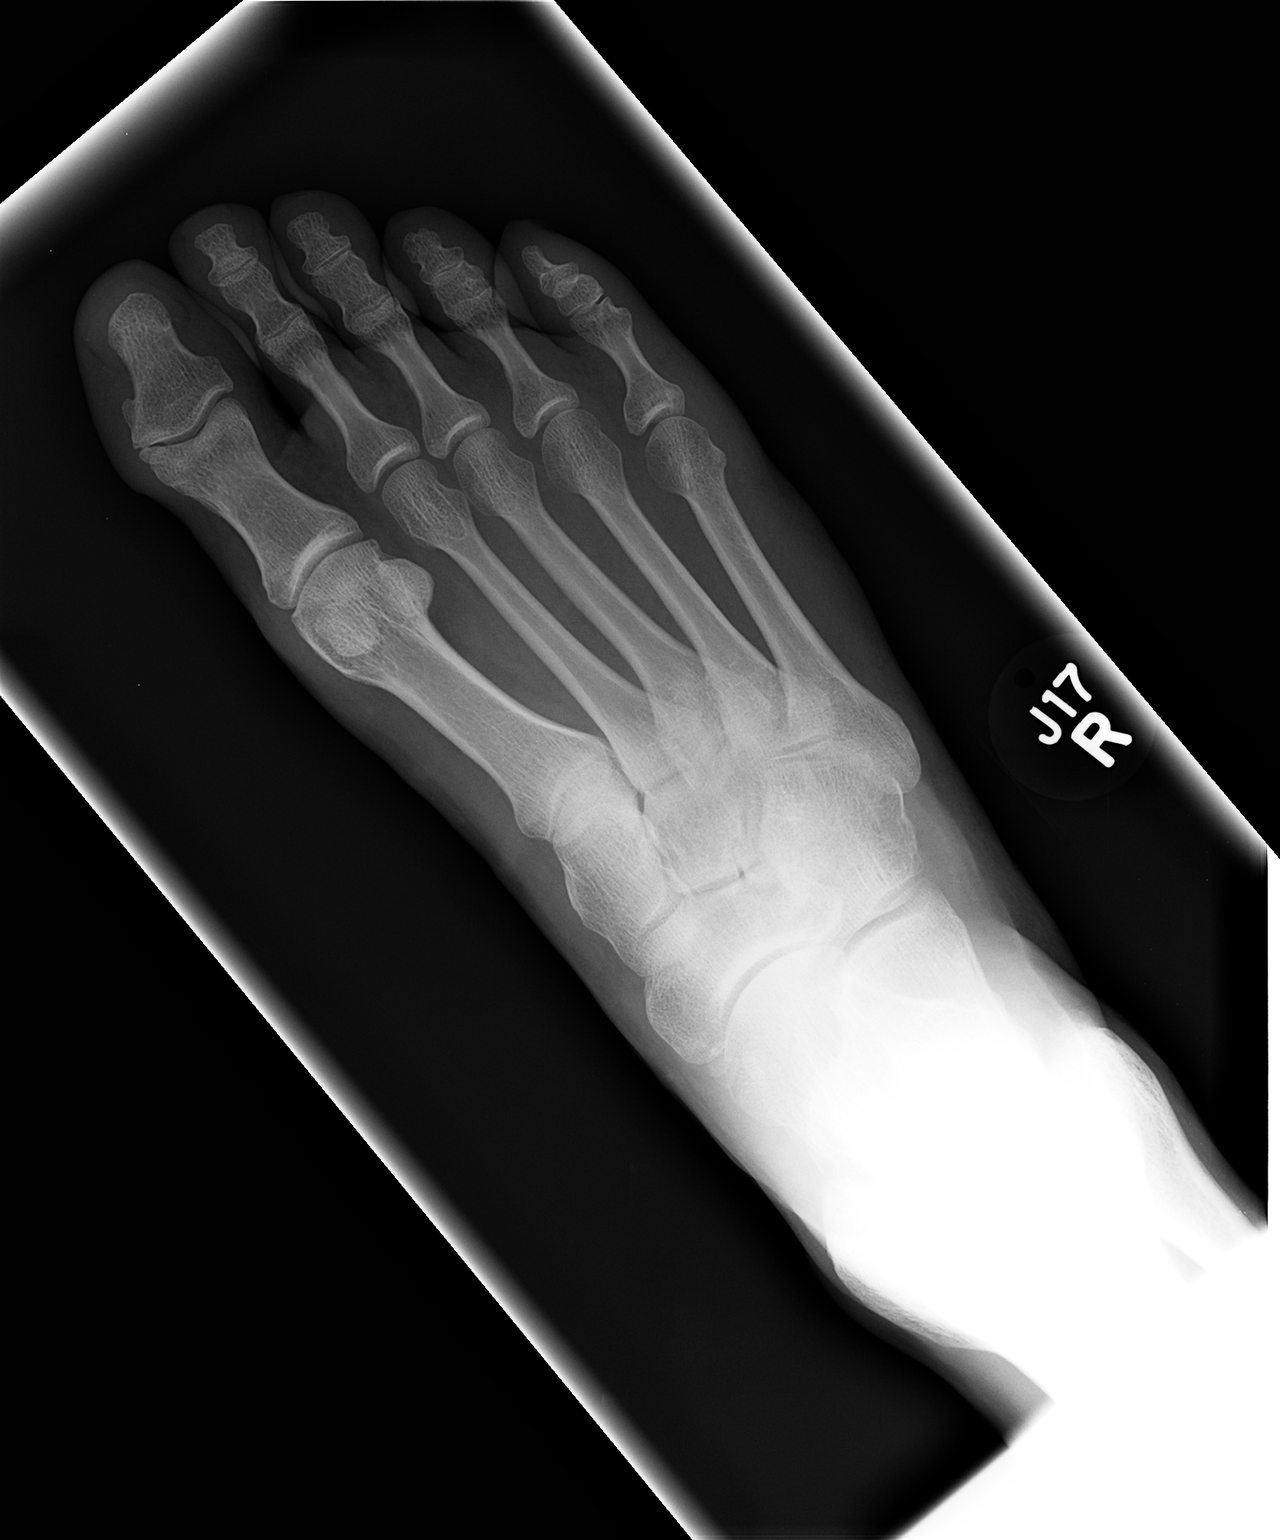

[view not recorded (2 of 3)]
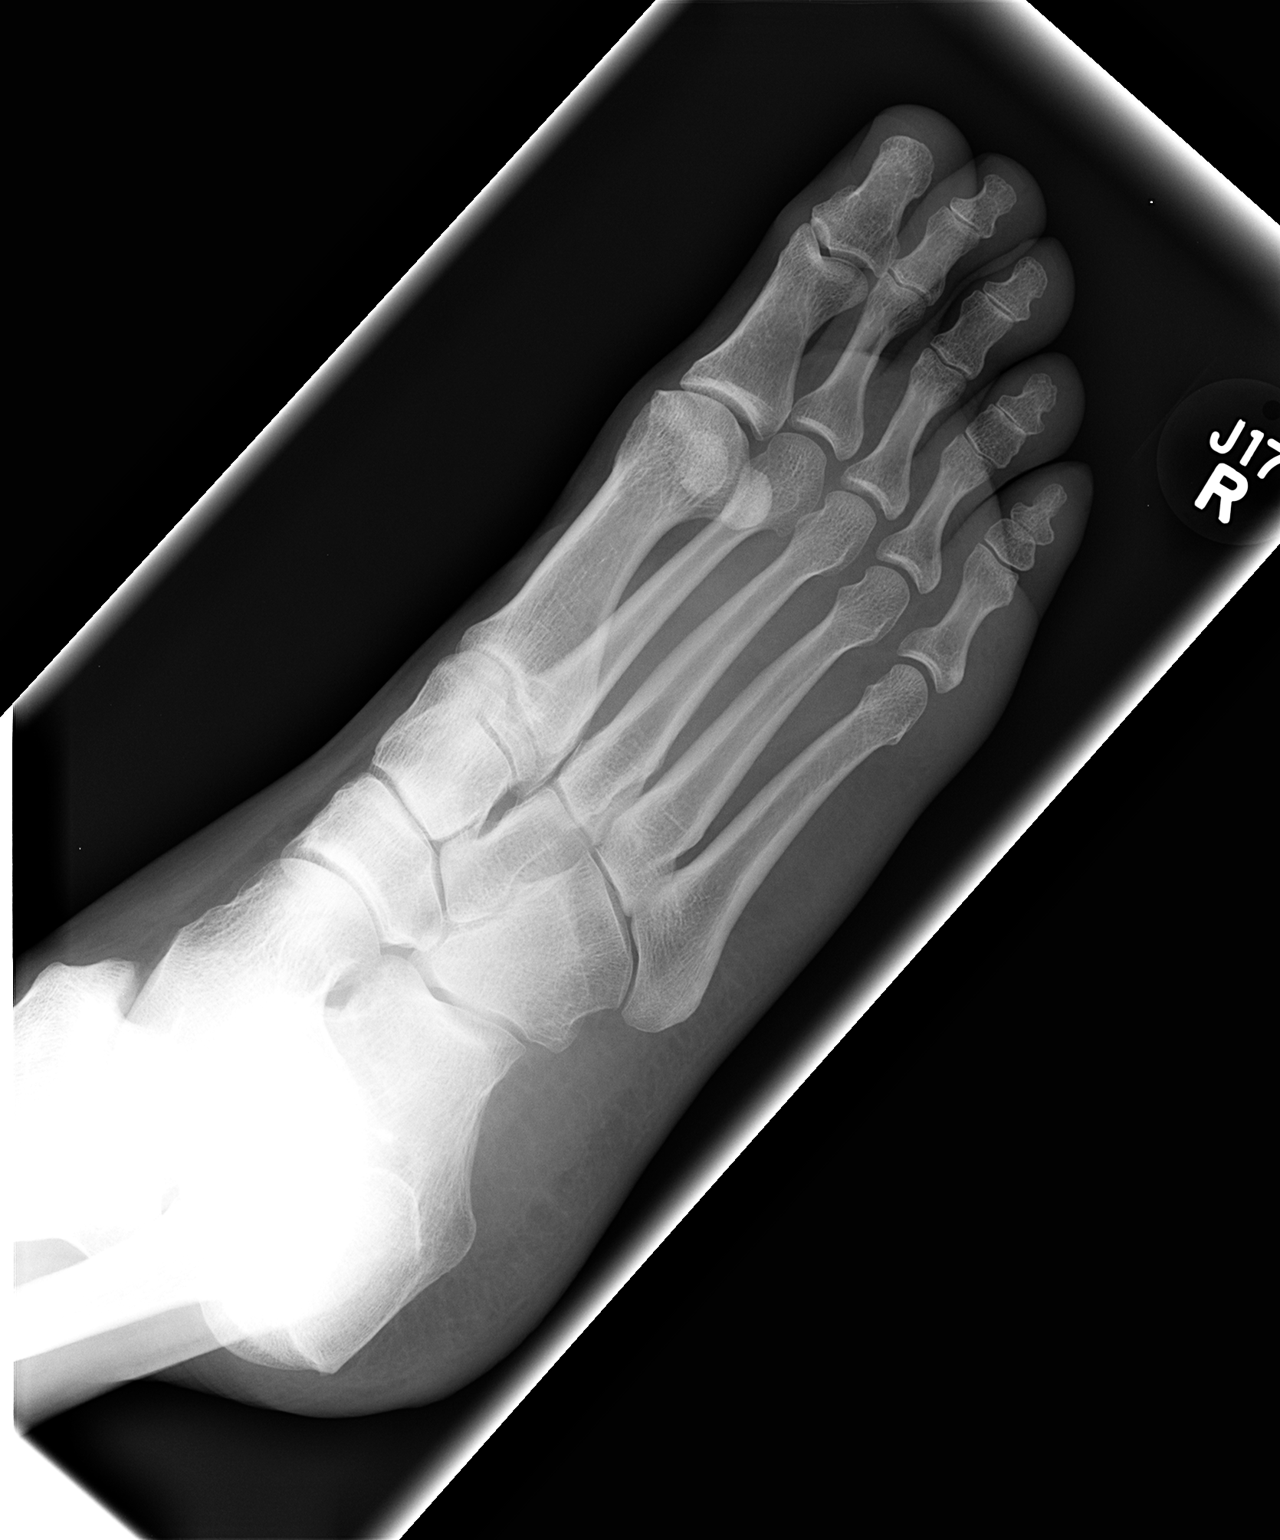

[view not recorded (3 of 3)]
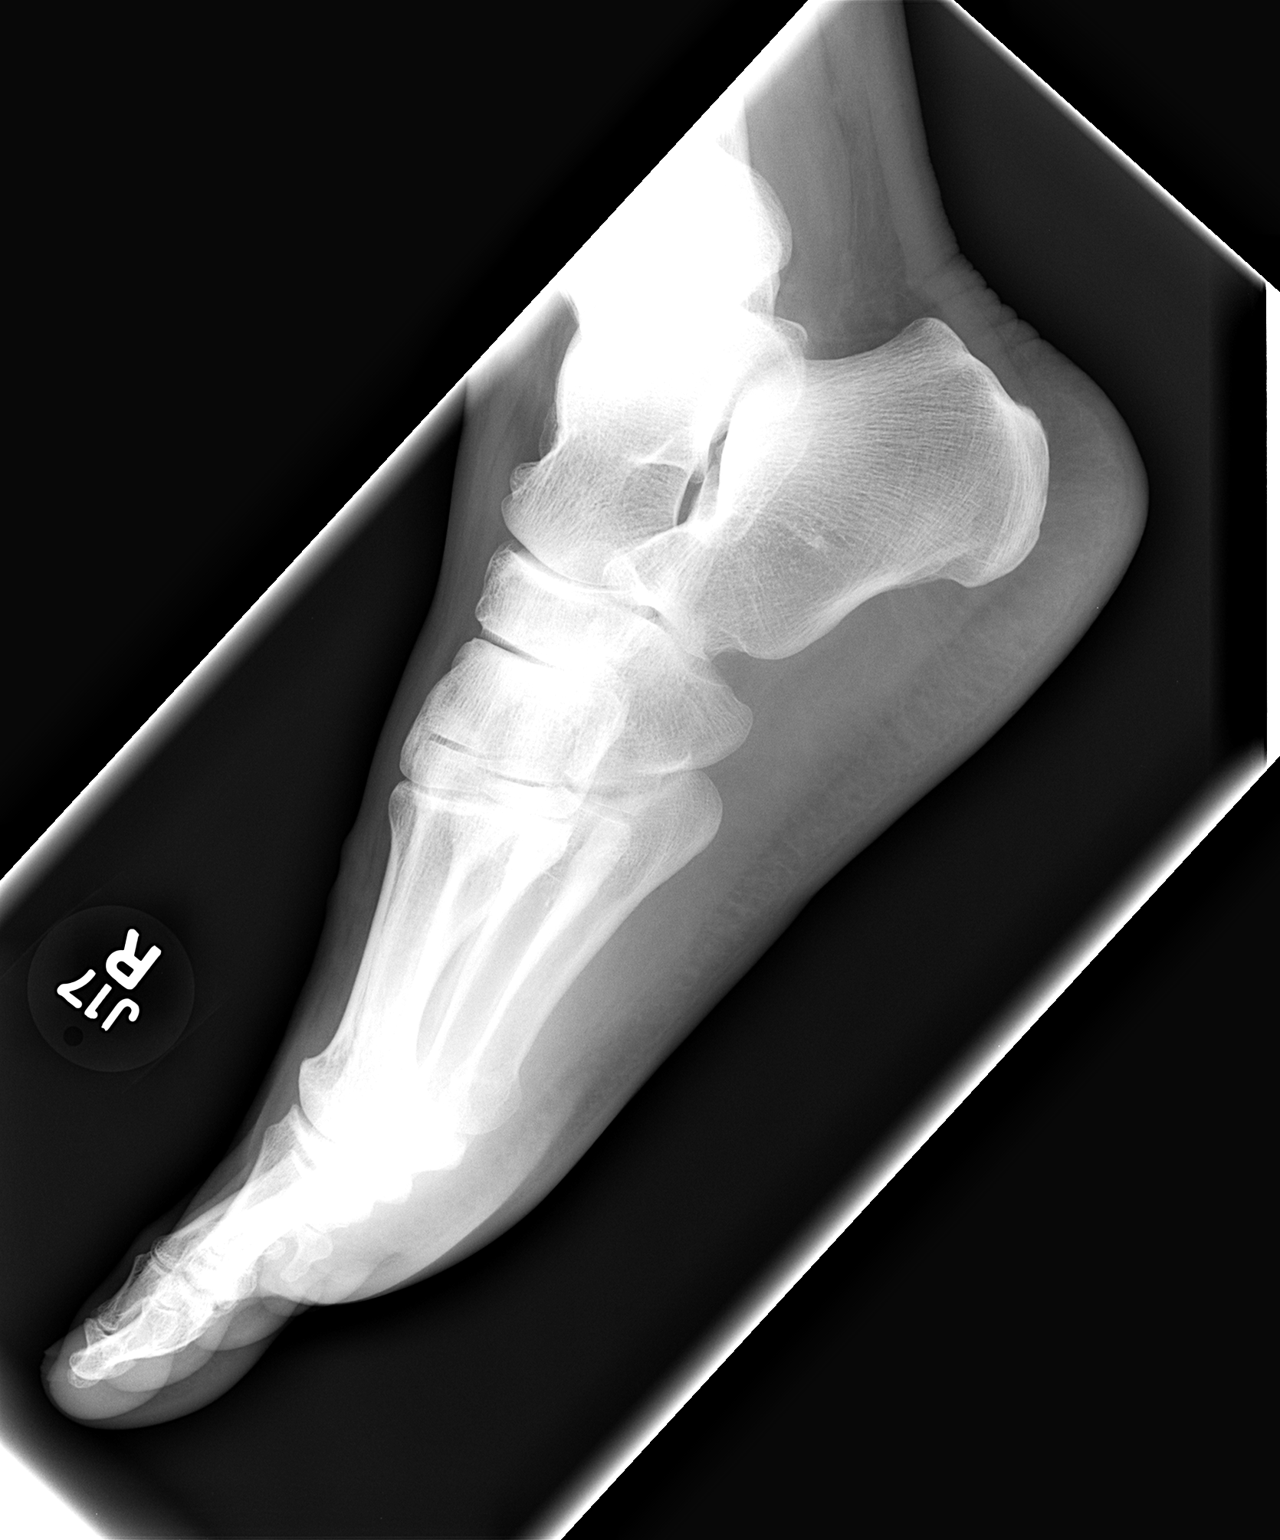

[3 of 3 positions shown; findings below may reference images not displayed]

FINDINGS: No opaque foreign body is seen.  No fracture is noted.
Alignment is normal and joint spaces appear normal.
IMPRESSION: No opaque foreign body.  No fracture.

## 2020-05-09 ENCOUNTER — Other Ambulatory Visit: Payer: Self-pay | Admitting: Oncology

## 2020-05-09 DIAGNOSIS — U071 COVID-19: Secondary | ICD-10-CM

## 2020-05-09 NOTE — Progress Notes (Signed)
I connected by phone with Mr. Correll  to discuss the potential use of an new treatment for mild to moderate COVID-19 viral infection in non-hospitalized patients.   This patient is a age/sex that meets the FDA criteria for Emergency Use Authorization of casirivimab\imdevimab.  Has a (+) direct SARS-CoV-2 viral test result 1. Has mild or moderate COVID-19  2. Is ? 52 years of age and weighs ? 40 kg 3. Is NOT hospitalized due to COVID-19 4. Is NOT requiring oxygen therapy or requiring an increase in baseline oxygen flow rate due to COVID-19 5. Is within 10 days of symptom onset 6. Has at least one of the high risk factor(s) for progression to severe COVID-19 and/or hospitalization as defined in EUA. Specific high risk criteria : Past Medical History:  Diagnosis Date  . Carpal tunnel syndrome, bilateral   . No pertinent past medical history   ?  ?    Symptom onset 05/04/2020   I have spoken and communicated the following to the patient or parent/caregiver:   1. FDA has authorized the emergency use of casirivimab\imdevimab for the treatment of mild to moderate COVID-19 in adults and pediatric patients with positive results of direct SARS-CoV-2 viral testing who are 31 years of age and older weighing at least 40 kg, and who are at high risk for progressing to severe COVID-19 and/or hospitalization.   2. The significant known and potential risks and benefits of casirivimab\imdevimab, and the extent to which such potential risks and benefits are unknown.   3. Information on available alternative treatments and the risks and benefits of those alternatives, including clinical trials.   4. Patients treated with casirivimab\imdevimab should continue to self-isolate and use infection control measures (e.g., wear mask, isolate, social distance, avoid sharing personal items, clean and disinfect "high touch" surfaces, and frequent handwashing) according to CDC guidelines.    5. The patient or  parent/caregiver has the option to accept or refuse casirivimab\imdevimab .   After reviewing this information with the patient, The patient agreed to proceed with receiving casirivimab\imdevimab infusion and will be provided a copy of the Fact sheet prior to receiving the infusion.Mignon Pine, AGNP-C [phone] (Infusion Center Hotline)

## 2020-05-10 ENCOUNTER — Ambulatory Visit (HOSPITAL_COMMUNITY)
Admission: RE | Admit: 2020-05-10 | Discharge: 2020-05-10 | Disposition: A | Discharge status: Home / Self Care | Payer: 59 | Source: Ambulatory Visit | Attending: Pulmonary Disease | Admitting: Pulmonary Disease

## 2020-05-10 DIAGNOSIS — U071 COVID-19: Secondary | ICD-10-CM | POA: Insufficient documentation

## 2020-05-10 MED ORDER — SODIUM CHLORIDE 0.9 % IV SOLN
1200.0000 mg | Freq: Once | INTRAVENOUS | Status: AC
  Administered 2020-05-10: 1200 mg via INTRAVENOUS

## 2020-05-10 MED ORDER — FAMOTIDINE IN NACL 20-0.9 MG/50ML-% IV SOLN: 20.0000 mg | Freq: Once | INTRAVENOUS | Status: DC | PRN

## 2020-05-10 MED ORDER — METHYLPREDNISOLONE SODIUM SUCC 125 MG IJ SOLR: 125.0000 mg | Freq: Once | INTRAMUSCULAR | Status: DC | PRN

## 2020-05-10 MED ORDER — DIPHENHYDRAMINE HCL 50 MG/ML IJ SOLN: 50.0000 mg | Freq: Once | INTRAMUSCULAR | Status: DC | PRN

## 2020-05-10 MED ORDER — SODIUM CHLORIDE 0.9 % IV SOLN: INTRAVENOUS | Status: DC | PRN

## 2020-05-10 MED ORDER — ALBUTEROL SULFATE HFA 108 (90 BASE) MCG/ACT IN AERS: 2.0000 | INHALATION_SPRAY | Freq: Once | RESPIRATORY_TRACT | Status: DC | PRN

## 2020-05-10 MED ORDER — EPINEPHRINE 0.3 MG/0.3ML IJ SOAJ: 0.3000 mg | Freq: Once | INTRAMUSCULAR | Status: DC | PRN

## 2020-05-10 NOTE — Progress Notes (Signed)
  Diagnosis: COVID-19  Physician: Dr Wright  Procedure: Covid Infusion Clinic Med: casirivimab\imdevimab infusion - Provided patient with casirivimab\imdevimab fact sheet for patients, parents and caregivers prior to infusion.  Complications: No immediate complications noted.  Discharge: Discharged home   Douglas Ramos 05/10/2020  

## 2020-05-10 NOTE — Discharge Instructions (Signed)

## 2020-05-14 ENCOUNTER — Emergency Department (HOSPITAL_BASED_OUTPATIENT_CLINIC_OR_DEPARTMENT_OTHER): Payer: 59

## 2020-05-14 ENCOUNTER — Encounter (HOSPITAL_BASED_OUTPATIENT_CLINIC_OR_DEPARTMENT_OTHER): Payer: Self-pay | Admitting: Emergency Medicine

## 2020-05-14 ENCOUNTER — Other Ambulatory Visit: Payer: Self-pay

## 2020-05-14 ENCOUNTER — Emergency Department (HOSPITAL_BASED_OUTPATIENT_CLINIC_OR_DEPARTMENT_OTHER)
Admission: EM | Admit: 2020-05-14 | Discharge: 2020-05-14 | Disposition: A | Discharge status: Home / Self Care | Payer: 59 | Attending: Emergency Medicine | Admitting: Emergency Medicine

## 2020-05-14 DIAGNOSIS — R531 Weakness: Secondary | ICD-10-CM | POA: Diagnosis present

## 2020-05-14 DIAGNOSIS — U099 Post covid-19 condition, unspecified: Secondary | ICD-10-CM | POA: Insufficient documentation

## 2020-05-14 DIAGNOSIS — R110 Nausea: Secondary | ICD-10-CM | POA: Insufficient documentation

## 2020-05-14 DIAGNOSIS — U071 COVID-19: Secondary | ICD-10-CM | POA: Diagnosis not present

## 2020-05-14 DIAGNOSIS — Z87891 Personal history of nicotine dependence: Secondary | ICD-10-CM | POA: Diagnosis not present

## 2020-05-14 LAB — COMPREHENSIVE METABOLIC PANEL
ALT: 30 U/L (ref 0–44)
AST: 21 U/L (ref 15–41)
Albumin: 3.3 g/dL — ABNORMAL LOW (ref 3.5–5.0)
Alkaline Phosphatase: 56 U/L (ref 38–126)
Anion gap: 11 (ref 5–15)
BUN: 15 mg/dL (ref 6–20)
CO2: 27 mmol/L (ref 22–32)
Calcium: 8.6 mg/dL — ABNORMAL LOW (ref 8.9–10.3)
Chloride: 99 mmol/L (ref 98–111)
Creatinine, Ser: 0.99 mg/dL (ref 0.61–1.24)
GFR, Estimated: >60 mL/min (ref >60)
Glucose, Bld: 118 mg/dL — ABNORMAL HIGH (ref 70–99)
Potassium: 3.7 mmol/L (ref 3.5–5.1)
Sodium: 137 mmol/L (ref 135–145)
Total Bilirubin: 0.9 mg/dL (ref 0.3–1.2)
Total Protein: 7.6 g/dL (ref 6.5–8.1)

## 2020-05-14 LAB — CBC
HCT: 46 % (ref 39.0–52.0)
Hemoglobin: 15.8 g/dL (ref 13.0–17.0)
MCH: 30.6 pg (ref 26.0–34.0)
MCHC: 34.3 g/dL (ref 30.0–36.0)
MCV: 89.1 fL (ref 80.0–100.0)
Platelets: 269 10*3/uL (ref 150–400)
RBC: 5.16 MIL/uL (ref 4.22–5.81)
RDW: 12.6 % (ref 11.5–15.5)
WBC: 9.7 10*3/uL (ref 4.0–10.5)
nRBC: 0 % (ref 0.0–0.2)

## 2020-05-14 MED ORDER — SODIUM CHLORIDE 0.9 % IV BOLUS
1000.0000 mL | Freq: Once | INTRAVENOUS | Status: AC
  Administered 2020-05-14: 1000 mL via INTRAVENOUS

## 2020-05-14 MED ORDER — ONDANSETRON 4 MG PO TBDP: 4.0000 mg | ORAL_TABLET | Freq: Three times a day (TID) | ORAL | 0 refills | Status: AC | PRN

## 2020-05-14 MED ORDER — ONDANSETRON HCL 4 MG/2ML IJ SOLN
4.0000 mg | Freq: Once | INTRAMUSCULAR | Status: AC | PRN
  Administered 2020-05-14: 4 mg via INTRAVENOUS
  Filled 2020-05-14: qty 2

## 2020-05-14 NOTE — ED Notes (Signed)
Pt with generalized weakness, body aches, covid x9 days, had antibody infusion 3 days ago with some improvement.  Arrives with nausea, IV access initiated, medicated with zofran.

## 2020-05-14 NOTE — ED Notes (Addendum)
Pt endorses drinking sprite, denies nausea or emesis at this time, EDP updated

## 2020-05-14 NOTE — ED Notes (Signed)
Per EDP order, pt given fluids and/or food for PO challenge. Pt verbalized understanding to utilize call bell if nausea or emesis occur. 

## 2020-05-14 NOTE — Discharge Instructions (Addendum)
Your work-up today appeared assuring, I think that your symptoms are most likely coming from your Covid infection.  You can use the Zofran as needed for your nausea, make sure to stay hydrated.  Please use the attached instructions.  I want you to follow-up with your primary care in the next couple of days, he can do this via telehealth.  Please stay hydrated, get plenty of rest.  Please come back to the emergency department for any new worsening concerning symptoms such as shortness of breath.  As we spoke about there is some pneumonia developing in your lungs, you can get this rechecked in the next 2 weeks with your PCP.

## 2020-05-14 NOTE — ED Notes (Signed)
ED Provider at bedside. 

## 2020-05-14 NOTE — ED Notes (Signed)
Pt discharged to home. Discharge instructions have been discussed with patient and/or family members. Pt verbally acknowledges understanding d/c instructions, and endorses comprehension to checkout at registration before leaving.  °

## 2020-05-14 NOTE — ED Provider Notes (Signed)
[facility] EMERGENCY DEPARTMENT Provider Note   CSN: [id number] Arrival date & time: 05/14/20  1014     History Chief Complaint  Patient presents with  . Weakness    COVID+    Douglas Ramos is a 54 y.o. male with no pertinent past medical history that presents emergency department today for weakness, body aches and nausea for the past 9 days.  Patient states that he was diagnosed with Covid 2 weeks ago, got Mab infusion on Wednesday.  States that for the past 2 weeks he has been feeling weak with myalgias and nausea.  States that he has not been able to eat anything for the past 2 days due to nausea.  Has not taken anything for this besides Tylenol and Advil.  Patient states that fever broke 2 days ago.  Did not get vaccinated against Covid.  Patient states that he is generally healthy, does not take any medications.  Denies any cough, shortness of breath, chest pain.  Denies any pain anywhere currently.  States that sometimes he has myalgias in bilateral lower extremities.  Denies any swelling, numbness, tingling.  Denies any headache, vision changes, neck pain.  States that he primarily came into control nausea.  Low p.o. intake.  Has not vomited.  No diarrhea or constipation.  No abdominal pain or back pain.  Denies any sick contacts.  Patient states for the past couple days he has been out of work, taking it easy and try to get rest.  Has been urinating normally, no dysuria or hematuria.  HPI     Past Medical History:  Diagnosis Date  . Carpal tunnel syndrome, bilateral   . No pertinent past medical history     There are no problems to display for this patient.   Past Surgical History:  Procedure Laterality Date  . CARPAL TUNNEL RELEASE  03/18/2012   Procedure: CARPAL TUNNEL RELEASE;  Surgeon: Nicki ReaperGary R Kuzma, MD;  Location: [facility];  Service: Orthopedics;  Laterality: Left;  . CARPAL TUNNEL RELEASE  07/22/2012   Procedure: CARPAL TUNNEL RELEASE;   Surgeon: Nicki ReaperGary R Kuzma, MD;  Location: [facility];  Service: Orthopedics;  Laterality: Right;  . FRACTURE SURGERY  2004   orif lt foot-pins/screws       No family history on file.  Social History   Tobacco Use  . Smoking status: Former Smoker    Quit date: 03/13/2001    Years since quitting: 19.1  . Smokeless tobacco: Never Used  Substance Use Topics  . Alcohol use: Yes    Comment: occ  . Drug use: No    Home Medications Prior to Admission medications   Medication Sig Start Date End Date Taking? Authorizing Provider  amoxicillin-clavulanate (AUGMENTIN) 875-125 MG per tablet Take 1 tablet by mouth 2 (two) times daily. 11/16/12   Cathlyn ParsonsKabbe, Angela M, NP  HYDROcodone-acetaminophen (NORCO) 5-325 MG per tablet Take 1 tablet by mouth every 6 (six) hours as needed for pain. 07/22/12   Cindee SaltKuzma, Gary, MD  ibuprofen (ADVIL,MOTRIN) 200 MG tablet Take 200 mg by mouth every 6 (six) hours as needed.    [provider]  ondansetron (ZOFRAN ODT) 4 MG disintegrating tablet Take 1 tablet (4 mg total) by mouth every 8 (eight) hours as needed for nausea or vomiting. 05/14/20   Farrel GordonPatel, Hilja Kintzel, PA-C    Allergies    Patient has no known allergies.  Review of Systems   Review of Systems  Constitutional: Negative for  chills, diaphoresis, fatigue and fever.  HENT: Negative for congestion, sore throat and trouble swallowing.   Eyes: Negative for pain and visual disturbance.  Respiratory: Negative for cough, shortness of breath and wheezing.   Cardiovascular: Negative for chest pain, palpitations and leg swelling.  Gastrointestinal: Positive for nausea. Negative for abdominal distention, abdominal pain, diarrhea and vomiting.  Genitourinary: Negative for difficulty urinating, flank pain and hematuria.  Musculoskeletal: Positive for arthralgias and myalgias. Negative for back pain, neck pain and neck stiffness.  Skin: Negative for pallor.  Neurological: Positive for weakness. Negative  for dizziness, speech difficulty and headaches.  Psychiatric/Behavioral: Negative for confusion.    Physical Exam Updated Vital Signs BP (!) 113/58 (BP Location: Right Arm)   Pulse 61   Temp 98.6 F (37 C) (Oral)   Resp 19   Ht 6' (1.829 m)   Wt 95.3 kg   SpO2 96%   BMI 28.48 kg/m   Physical Exam Constitutional:      General: He is not in acute distress.    Appearance: Normal appearance. He is not ill-appearing, toxic-appearing or diaphoretic.     Comments: Patient without acute respiratory stress.  Patient is sitting comfortably in bed, no tripoding, use of accessory muscles.  Patient is speaking to me in full sentences.  Handling secretions well.  HENT:     Head: Normocephalic and atraumatic.     Jaw: There is normal jaw occlusion. No trismus, swelling or malocclusion.     Nose: No congestion or rhinorrhea.     Right Sinus: No maxillary sinus tenderness or frontal sinus tenderness.     Left Sinus: No maxillary sinus tenderness or frontal sinus tenderness.     Mouth/Throat:     Mouth: Mucous membranes are moist. No oral lesions.     Dentition: Normal dentition.     Tongue: No lesions.     Palate: No mass and lesions.     Pharynx: Oropharynx is clear. Uvula midline. No pharyngeal swelling, oropharyngeal exudate, posterior oropharyngeal erythema or uvula swelling.     Tonsils: No tonsillar exudate or tonsillar abscesses. 1+ on the right. 1+ on the left.  Eyes:     General: No visual field deficit or scleral icterus.       Right eye: No discharge.        Left eye: No discharge.     Extraocular Movements: Extraocular movements intact.     Conjunctiva/sclera: Conjunctivae normal.     Pupils: Pupils are equal, round, and reactive to light.  Cardiovascular:     Rate and Rhythm: Normal rate and regular rhythm.     Pulses: Normal pulses.     Heart sounds: Normal heart sounds. No murmur heard.  No friction rub. No gallop.   Pulmonary:     Effort: Pulmonary effort is normal. No  respiratory distress.     Breath sounds: Normal breath sounds. No stridor. No wheezing, rhonchi or rales.  Chest:     Chest wall: No tenderness.  Abdominal:     General: Abdomen is flat. Bowel sounds are normal. There is no distension.     Palpations: Abdomen is soft.     Tenderness: There is no abdominal tenderness. There is no right CVA tenderness, left CVA tenderness, guarding or rebound.  Musculoskeletal:        General: No swelling or tenderness. Normal range of motion.     Cervical back: Normal range of motion and neck supple. No rigidity or tenderness.     Right  lower leg: No edema.     Left lower leg: No edema.     Comments: Bilateral lower extremities without any edema, no skin changes or discoloration. Mild tenderness to palpation bilaterally on entire leg.  Normal sensation and strength.  PT pulses 2+.  Lymphadenopathy:     Cervical: No cervical adenopathy.  Skin:    General: Skin is warm and dry.     Capillary Refill: Capillary refill takes less than 2 seconds.     Coloration: Skin is not pale.     Findings: No erythema or rash.  Neurological:     General: No focal deficit present.     Mental Status: He is alert and oriented to person, place, and time.     Cranial Nerves: Cranial nerves are intact. No cranial nerve deficit or facial asymmetry.     Motor: Motor function is intact. No weakness.     Coordination: Coordination is intact.     Gait: Gait is intact. Gait normal.     Comments: Patient with 5/5 strength to bilateral upper and lower extremities, normal sensation.     Psychiatric:        Mood and Affect: Mood normal.        Behavior: Behavior normal.     ED Results / Procedures / Treatments   Labs (all labs ordered are listed, but only abnormal results are displayed) Labs Reviewed  COMPREHENSIVE METABOLIC PANEL - Abnormal; Notable for the following components:      Result Value   Glucose, Bld 118 (*)    Calcium 8.6 (*)    Albumin 3.3 (*)    All other  components within normal limits  CBC    EKG EKG Interpretation  Date/Time:  Sunday May 14 2020 10:44:11 EDT Ventricular Rate:  65 PR Interval:  156 QRS Duration: 110 QT Interval:  382 QTC Calculation: 397 R Axis:   66 Text Interpretation: Normal sinus rhythm Normal ECG No prior ECG for comparison. NO STEMI Confirmed by Theda Belfast ([id number]) on 05/14/2020 11:58:03 AM   Radiology DG Chest Portable 1 View  Result Date: 05/14/2020 CLINICAL DATA:  COVID positive.  Shortness of breath. EXAM: PORTABLE CHEST 1 VIEW COMPARISON:  None. FINDINGS: The heart size and mediastinal contours are within normal limits. Patchy opacities are identified in the lateral bilateral mid lungs. The visualized skeletal structures are unremarkable. IMPRESSION: Patchy opacities identified in the lateral bilateral mid lungs consistent with pneumonias. Electronically Signed   By: Sherian Rein M.D.   On: 05/14/2020 11:49    Procedures Procedures (including critical care time)  Medications Ordered in ED Medications  ondansetron (ZOFRAN) injection 4 mg (4 mg Intravenous Given 05/14/20 1042)  sodium chloride 0.9 % bolus 1,000 mL ( Intravenous Stopped 05/14/20 1316)    ED Course  I have reviewed the triage vital signs and the nursing notes.  Pertinent labs & imaging results that were available during my care of the patient were reviewed by me and considered in my medical decision making (see chart for details).  Clinical Course as of May 14 1444  Sun May 14, 2020  1142 WBC: 9.7 [SP]    Clinical Course User Index [SP] Farrel Gordon, PA-C   MDM Rules/Calculators/A&P                         Douglas Ramos is a 54 y.o. male with no pertinent past medical history that presents emergency department today for weakness, body aches  and nausea for the past 9 days.  Patient recently diagnosed with Covid 2 weeks ago, not complaining of any respiratory symptoms, appears well.  Main concerns are nausea, plan to get  nausea under control and to give IV fluids and reassess.Shared decision making about bilateral DVT study.  We decided this is not necessary at this time since patient most likely has myalgias due to Covid, no signs of DVT on physical exam.  ECG interpreted by me demonstrated NSR.  CXR interpreted by me demonstrated patchy infiltrates consistent with COVID-19 pneumonia, patient is not having any pneumonia symptoms.  Patient afebrile, no cough.  Labs demonstrated normal CBC and CMP.  Upon reassessment patient states that he feels much better than when he came in.  States that nausea has resolved, states that liter of fluid really helped him, states that he does not feel that weak anymore.  Did discuss lung findings on chest x-ray, patient will follow up with PCP in the next couple of days.  Patient passed p.o. challenge, pressures improved.  Patient to be discharged.  Given the above findings, my suspicion is that patient having symptoms consistent with Covid, including myalgias and low p.o. intake due to nausea.  Patient does appear dry, after 1 L of fluid patient states that he feels much better.  Doubt need for further emergent work up at this time. I explained the diagnosis and have given explicit precautions to return to the ER including for any other new or worsening symptoms. The patient understands and accepts the medical plan as it's been dictated and I have answered their questions. Discharge instructions concerning home care and prescriptions have been given. The patient is STABLE and is discharged to home in good condition.    Final Clinical Impression(s) / ED Diagnoses Final diagnoses:  Generalized weakness  Nausea    Rx / DC Orders ED Discharge Orders         Ordered    ondansetron (ZOFRAN ODT) 4 MG disintegrating tablet  Every 8 hours PRN        05/14/20 1439           Farrel Gordon, PA-C 05/14/20 1445    Tegeler, Canary Brim, MD 05/14/20 1546

## 2020-05-14 NOTE — ED Triage Notes (Signed)
Diagnosed with COVID x 2 weeks ago. Had the antibody infusion this week. C/o generalized weakness, nausea, and decreased appetite. Also reports pain to back of both legs.

## 2021-08-23 IMAGING — DX DG CHEST 1V PORT
1 series · 1 of 1 positions shown · non-contrast
Comparison: None.

CLINICAL DATA: COVID positive.  Shortness of breath.

EXAM:
PORTABLE CHEST 1 VIEW

[chest ap]
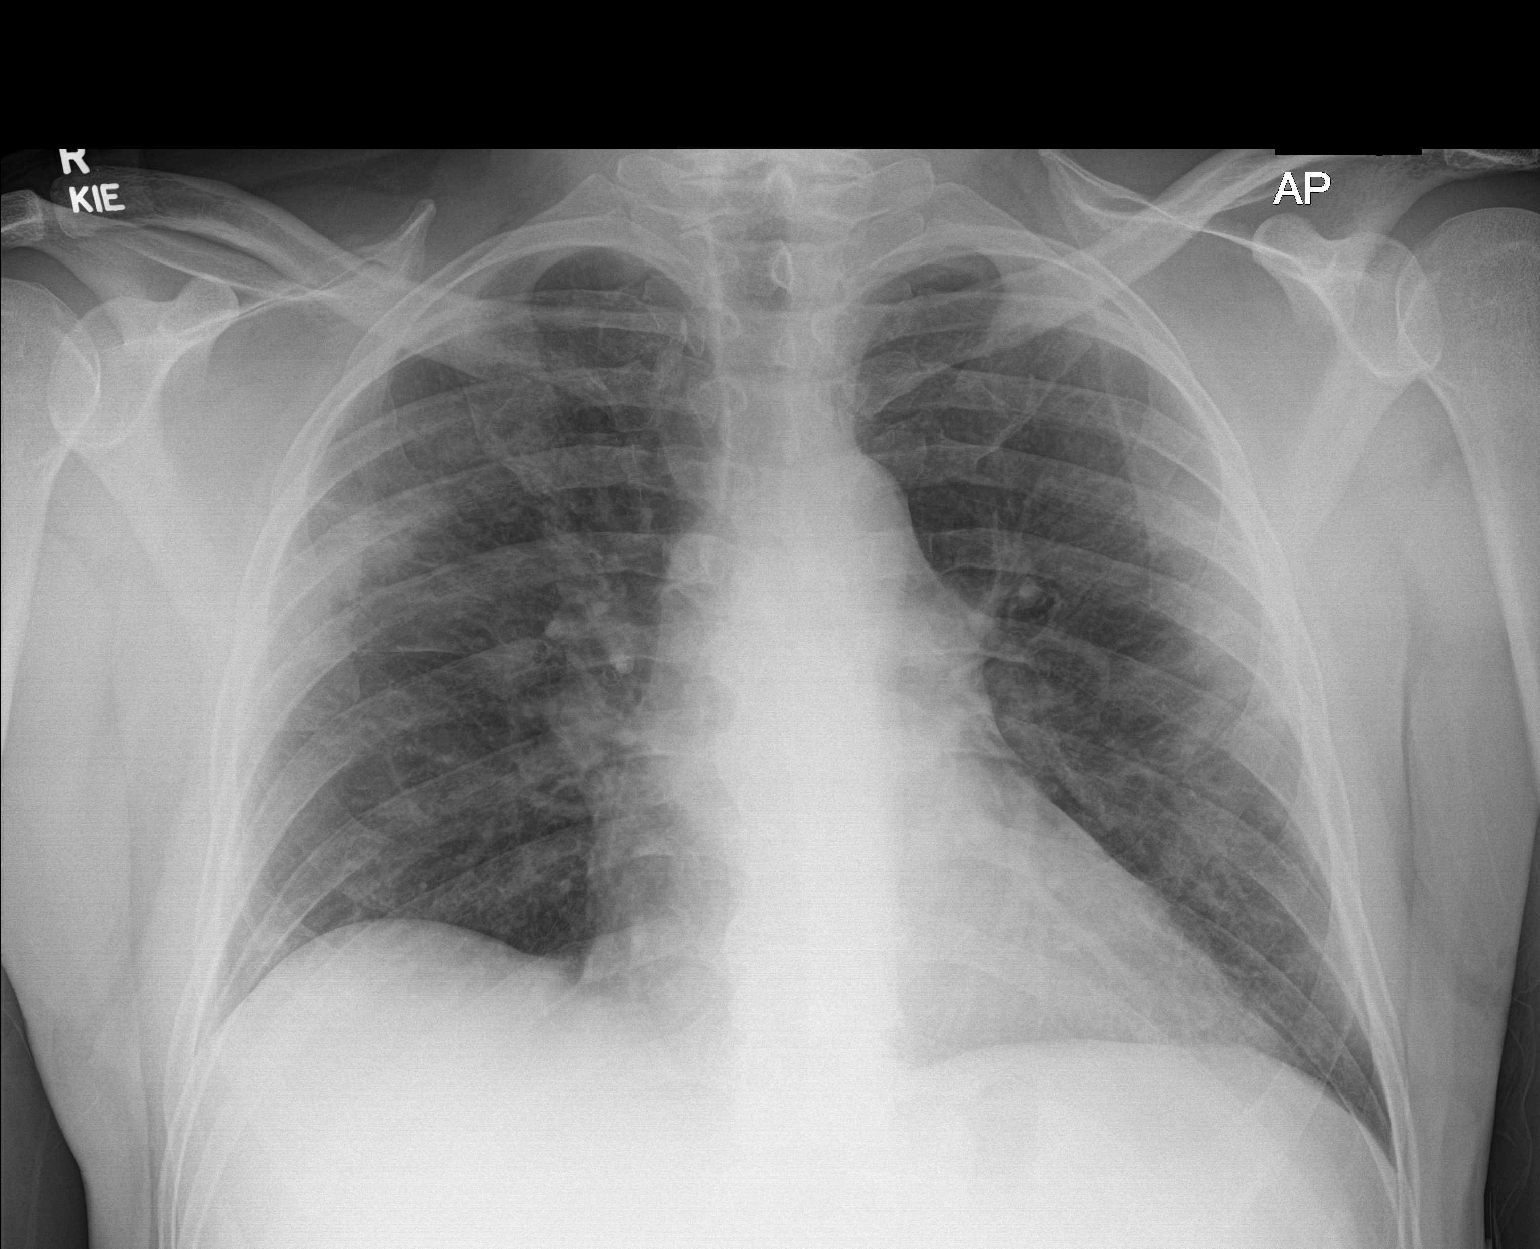

[1 of 1 positions shown; findings below may reference images not displayed]

FINDINGS: The heart size and mediastinal contours are within normal limits.
Patchy opacities are identified in the lateral bilateral mid lungs.
The visualized skeletal structures are unremarkable.
IMPRESSION: Patchy opacities identified in the lateral bilateral mid lungs
consistent with pneumonias.
